# Patient Record
Sex: Male | Born: 1970 | Race: White | Hispanic: No | Marital: Married | Smoking: Never smoker
Health system: Southern US, Community
[De-identification: ages and names within clinical notes are randomized; demographics above are authoritative.]

## PROBLEM LIST (undated history)

## (undated) DIAGNOSIS — E079 Disorder of thyroid, unspecified: Secondary | ICD-10-CM

## (undated) DIAGNOSIS — N200 Calculus of kidney: Secondary | ICD-10-CM

## (undated) DIAGNOSIS — I10 Essential (primary) hypertension: Secondary | ICD-10-CM

---

## 2013-08-11 ENCOUNTER — Encounter: Payer: Self-pay | Admitting: Internal Medicine

## 2013-08-20 ENCOUNTER — Encounter (HOSPITAL_COMMUNITY): Payer: Self-pay | Admitting: Emergency Medicine

## 2013-08-20 ENCOUNTER — Emergency Department (HOSPITAL_COMMUNITY)
Admission: EM | Admit: 2013-08-20 | Discharge: 2013-08-20 | Disposition: A | Discharge status: Home / Self Care | Payer: BC Managed Care – PPO | Attending: Emergency Medicine | Admitting: Emergency Medicine

## 2013-08-20 ENCOUNTER — Emergency Department (HOSPITAL_COMMUNITY): Payer: BC Managed Care – PPO

## 2013-08-20 DIAGNOSIS — N200 Calculus of kidney: Secondary | ICD-10-CM | POA: Diagnosis present

## 2013-08-20 DIAGNOSIS — Z79899 Other long term (current) drug therapy: Secondary | ICD-10-CM | POA: Insufficient documentation

## 2013-08-20 DIAGNOSIS — I10 Essential (primary) hypertension: Secondary | ICD-10-CM | POA: Insufficient documentation

## 2013-08-20 DIAGNOSIS — R110 Nausea: Secondary | ICD-10-CM | POA: Insufficient documentation

## 2013-08-20 DIAGNOSIS — E079 Disorder of thyroid, unspecified: Secondary | ICD-10-CM | POA: Insufficient documentation

## 2013-08-20 HISTORY — DX: Disorder of thyroid, unspecified: E07.9

## 2013-08-20 HISTORY — DX: Essential (primary) hypertension: I10

## 2013-08-20 LAB — CBC WITH DIFFERENTIAL/PLATELET
Basophils Absolute: 0 10*3/uL (ref 0.0–0.1)
Basophils Relative: 0 % (ref 0–1)
Eosinophils Absolute: 0 10*3/uL (ref 0.0–0.7)
Eosinophils Relative: 0 % (ref 0–5)
HCT: 43.6 % (ref 39.0–52.0)
Hemoglobin: 15.3 g/dL (ref 13.0–17.0)
Lymphocytes Relative: 9 % — ABNORMAL LOW (ref 12–46)
Lymphs Abs: 1.2 10*3/uL (ref 0.7–4.0)
MCH: 29.9 pg (ref 26.0–34.0)
MCHC: 35.1 g/dL (ref 30.0–36.0)
MCV: 85.3 fL (ref 78.0–100.0)
Monocytes Absolute: 0.7 10*3/uL (ref 0.1–1.0)
Monocytes Relative: 5 % (ref 3–12)
Neutro Abs: 12.1 10*3/uL — ABNORMAL HIGH (ref 1.7–7.7)
Neutrophils Relative %: 87 % — ABNORMAL HIGH (ref 43–77)
Platelets: 278 10*3/uL (ref 150–400)
RBC: 5.11 MIL/uL (ref 4.22–5.81)
RDW: 12.9 % (ref 11.5–15.5)
WBC: 13.9 10*3/uL — ABNORMAL HIGH (ref 4.0–10.5)

## 2013-08-20 LAB — URINALYSIS, ROUTINE W REFLEX MICROSCOPIC
Bilirubin Urine: NEGATIVE
Glucose, UA: NEGATIVE mg/dL
Ketones, ur: NEGATIVE mg/dL
Leukocytes, UA: NEGATIVE
Nitrite: NEGATIVE
Protein, ur: NEGATIVE mg/dL
Specific Gravity, Urine: 1.029 (ref 1.005–1.030)
Urobilinogen, UA: 0.2 mg/dL (ref 0.0–1.0)
pH: 7 (ref 5.0–8.0)

## 2013-08-20 LAB — COMPREHENSIVE METABOLIC PANEL
ALT: 32 U/L (ref 0–53)
AST: 14 U/L (ref 0–37)
Albumin: 3.9 g/dL (ref 3.5–5.2)
Alkaline Phosphatase: 95 U/L (ref 39–117)
BUN: 21 mg/dL (ref 6–23)
CO2: 24 mEq/L (ref 19–32)
Calcium: 9.2 mg/dL (ref 8.4–10.5)
Chloride: 100 mEq/L (ref 96–112)
Creatinine, Ser: 1.17 mg/dL (ref 0.50–1.35)
GFR calc Af Amer: 87 mL/min — ABNORMAL LOW (ref >90)
GFR calc non Af Amer: 75 mL/min — ABNORMAL LOW (ref >90)
Glucose, Bld: 119 mg/dL — ABNORMAL HIGH (ref 70–99)
Potassium: 4.6 mEq/L (ref 3.7–5.3)
Sodium: 138 mEq/L (ref 137–147)
Total Bilirubin: 0.3 mg/dL (ref 0.3–1.2)
Total Protein: 7.3 g/dL (ref 6.0–8.3)

## 2013-08-20 LAB — URINE MICROSCOPIC-ADD ON

## 2013-08-20 MED ORDER — HYDROMORPHONE HCL PF 1 MG/ML IJ SOLN
1.0000 mg | Freq: Once | INTRAMUSCULAR | Status: AC
  Administered 2013-08-20: 1 mg via INTRAVENOUS
  Filled 2013-08-20: qty 1

## 2013-08-20 MED ORDER — SODIUM CHLORIDE 0.9 % IV BOLUS (SEPSIS)
1000.0000 mL | INTRAVENOUS | Status: AC
  Administered 2013-08-20: 1000 mL via INTRAVENOUS

## 2013-08-20 MED ORDER — HYDROMORPHONE HCL PF 1 MG/ML IJ SOLN: 0.5000 mg | Freq: Once | INTRAMUSCULAR | Status: DC

## 2013-08-20 MED ORDER — HYDROMORPHONE HCL PF 1 MG/ML IJ SOLN
1.0000 mg | INTRAMUSCULAR | Status: AC
  Administered 2013-08-20: 1 mg via INTRAVENOUS
  Filled 2013-08-20: qty 1

## 2013-08-20 MED ORDER — ONDANSETRON HCL 4 MG/2ML IJ SOLN
4.0000 mg | Freq: Once | INTRAMUSCULAR | Status: AC
  Administered 2013-08-20: 4 mg via INTRAVENOUS
  Filled 2013-08-20: qty 2

## 2013-08-20 MED ORDER — HYDROCODONE-ACETAMINOPHEN 5-325 MG PO TABS: 1.0000 | ORAL_TABLET | Freq: Four times a day (QID) | ORAL | Status: DC | PRN

## 2013-08-20 NOTE — Discharge Instructions (Signed)
Kidney Stones  Kidney stones (urolithiasis) are deposits that form inside your kidneys. The intense pain is caused by the stone moving through the urinary tract. When the stone moves, the ureter goes into spasm around the stone. The stone is usually passed in the urine.   CAUSES   · A disorder that makes certain neck glands produce too much parathyroid hormone (primary hyperparathyroidism).  · A buildup of uric acid crystals, similar to gout in your joints.  · Narrowing (stricture) of the ureter.  · A kidney obstruction present at birth (congenital obstruction).  · Previous surgery on the kidney or ureters.  · Numerous kidney infections.  SYMPTOMS   · Feeling sick to your stomach (nauseous).  · Throwing up (vomiting).  · Blood in the urine (hematuria).  · Pain that usually spreads (radiates) to the groin.  · Frequency or urgency of urination.  DIAGNOSIS   · Taking a history and physical exam.  · Blood or urine tests.  · CT scan.  · Occasionally, an examination of the inside of the urinary bladder (cystoscopy) is performed.  TREATMENT   · Observation.  · Increasing your fluid intake.  · Extracorporeal shock wave lithotripsy This is a noninvasive procedure that uses shock waves to break up kidney stones.  · Surgery may be needed if you have severe pain or persistent obstruction. There are various surgical procedures. Most of the procedures are performed with the use of small instruments. Only small incisions are needed to accommodate these instruments, so recovery time is minimized.  The size, location, and chemical composition are all important variables that will determine the proper choice of action for you. Talk to your health care provider to better understand your situation so that you will minimize the risk of injury to yourself and your kidney.   HOME CARE INSTRUCTIONS   · Drink enough water and fluids to keep your urine clear or pale yellow. This will help you to pass the stone or stone fragments.  · Strain  all urine through the provided strainer. Keep all particulate matter and stones for your health care provider to see. The stone causing the pain may be as small as a grain of salt. It is very important to use the strainer each and every time you pass your urine. The collection of your stone will allow your health care provider to analyze it and verify that a stone has actually passed. The stone analysis will often identify what you can do to reduce the incidence of recurrences.  · Only take over-the-counter or prescription medicines for pain, discomfort, or fever as directed by your health care provider.  · Make a follow-up appointment with your health care provider as directed.  · Get follow-up X-rays if required. The absence of pain does not always mean that the stone has passed. It may have only stopped moving. If the urine remains completely obstructed, it can cause loss of kidney function or even complete destruction of the kidney. It is your responsibility to make sure X-rays and follow-ups are completed. Ultrasounds of the kidney can show blockages and the status of the kidney. Ultrasounds are not associated with any radiation and can be performed easily in a matter of minutes.  SEEK MEDICAL CARE IF:  · You experience pain that is progressive and unresponsive to any pain medicine you have been prescribed.  SEEK IMMEDIATE MEDICAL CARE IF:   · Pain cannot be controlled with the prescribed medicine.  · You have a fever   or shaking chills.  · The severity or intensity of pain increases over 18 hours and is not relieved by pain medicine.  · You develop a new onset of abdominal pain.  · You feel faint or pass out.  · You are unable to urinate.  MAKE SURE YOU:   · Understand these instructions.  · Will watch your condition.  · Will get help right away if you are not doing well or get worse.  Document Released: 05/15/2005 Document Revised: 01/15/2013 Document Reviewed: 10/16/2012  [employer]® Patient Information ©2014  [employer].

## 2013-08-20 NOTE — ED Provider Notes (Signed)
CSN: [id number]     Arrival date & time 08/20/13  1434 History   First MD Initiated Contact with Patient 08/20/13 1522     Chief Complaint  Patient presents with  . Abdominal Pain     (Consider location/radiation/quality/duration/timing/severity/associated sxs/prior Treatment) Patient is a 43 y.o. male presenting with abdominal pain. The history is provided by the patient.  Abdominal Pain Pain location:  RLQ Pain quality: aching   Pain radiates to:  Does not radiate Pain severity:  Moderate Onset quality:  Sudden Duration:  12 hours Timing:  Constant Progression:  Unchanged Chronicity:  New Context comment:  While asleep Relieved by:  Nothing Worsened by:  Nothing tried Ineffective treatments:  None tried Associated symptoms: nausea   Associated symptoms: no chest pain, no cough, no diarrhea, no dysuria, no fever, no hematuria, no shortness of breath and no vomiting     Past Medical History  Diagnosis Date  . Hypertension   . Thyroid disease    History reviewed. No pertinent past surgical history. No family history on file. History  Substance Use Topics  . Smoking status: Never Smoker   . Smokeless tobacco: Not on file  . Alcohol Use: No    Review of Systems  Constitutional: Negative for fever.  HENT: Negative for drooling and rhinorrhea.   Eyes: Negative for pain.  Respiratory: Negative for cough and shortness of breath.   Cardiovascular: Negative for chest pain and leg swelling.  Gastrointestinal: Positive for nausea and abdominal pain. Negative for vomiting and diarrhea.  Genitourinary: Negative for dysuria and hematuria.  Musculoskeletal: Negative for gait problem and neck pain.  Skin: Negative for color change.  Neurological: Negative for numbness and headaches.  Hematological: Negative for adenopathy.  Psychiatric/Behavioral: Negative for behavioral problems.  All other systems reviewed and are negative.      Allergies  Sulfa antibiotics  Home  Medications  No current outpatient prescriptions on file. BP 146/75  Pulse 108  Temp(Src) 98.5 F (36.9 C) (Oral)  Resp 24  Wt 275 lb (124.739 kg)  SpO2 99% Physical Exam  Nursing note and vitals reviewed. Constitutional: He is oriented to person, place, and time. He appears well-developed and well-nourished.  HENT:  Head: Normocephalic and atraumatic.  Right Ear: External ear normal.  Left Ear: External ear normal.  Nose: Nose normal.  Mouth/Throat: Oropharynx is clear and moist. No oropharyngeal exudate.  Eyes: Conjunctivae and EOM are normal. Pupils are equal, round, and reactive to light.  Neck: Normal range of motion. Neck supple.  Cardiovascular: Normal rate, regular rhythm, normal heart sounds and intact distal pulses.  Exam reveals no gallop and no friction rub.   No murmur heard. Pulmonary/Chest: Effort normal and breath sounds normal. No respiratory distress. He has no wheezes.  Abdominal: Soft. Bowel sounds are normal. He exhibits no distension. There is no tenderness. There is no rebound and no guarding.  Musculoskeletal: Normal range of motion. He exhibits no edema and no tenderness.  No CVA tenderness to palpation noted bilaterally.  Neurological: He is alert and oriented to person, place, and time.  Skin: Skin is warm and dry.  Psychiatric: He has a normal mood and affect. His behavior is normal.    ED Course  Procedures (including critical care time) Labs Review Labs Reviewed  CBC WITH DIFFERENTIAL - Abnormal; Notable for the following:    WBC 13.9 (*)    Neutrophils Relative % 87 (*)    Neutro Abs 12.1 (*)    Lymphocytes Relative 9 (*)  All other components within normal limits  COMPREHENSIVE METABOLIC PANEL - Abnormal; Notable for the following:    Glucose, Bld 119 (*)    GFR calc non Af Amer 75 (*)    GFR calc Af Amer 87 (*)    All other components within normal limits  URINALYSIS, ROUTINE W REFLEX MICROSCOPIC - Abnormal; Notable for the following:     Hgb urine dipstick MODERATE (*)    All other components within normal limits  URINE CULTURE  URINE MICROSCOPIC-ADD ON   Imaging Review Ct Abdomen Pelvis Wo Contrast  08/20/2013   CLINICAL DATA:  Right lower quadrant abdominal pain with nausea. History of kidney stones.  EXAM: CT ABDOMEN AND PELVIS WITHOUT CONTRAST  TECHNIQUE: Multidetector CT imaging of the abdomen and pelvis was performed following the standard protocol without intravenous contrast.  COMPARISON:  None.  FINDINGS: The lung bases are clear aside from mild dependent atelectasis. There is no pleural or pericardial effusion.  The right kidney demonstrates mild hydronephrosis and perinephric soft tissue stranding. No renal calculi are demonstrated. However, there is a 3 mm distal right ureteral calculus. This is visualized on image 82 and is visible on the scout image. No other urinary tract calculi are seen. There is no left-sided hydronephrosis.  The liver demonstrates mildly decreased density consistent with steatosis. No focal lesion is apparent. The gallbladder, spleen, pancreas and adrenal glands appear normal.  The stomach, small bowel, appendix and colon demonstrate no significant findings. There are are no enlarged lymph nodes or vascular abnormalities on non contrast imaging. The prostate gland and seminal vesicles appear normal.  IMPRESSION: 1. Obstructing 3 mm calculus at the right ureterovesical junction with associated hydronephrosis. 2. No other evidence of urinary tract calculus. 3. Mild hepatic steatosis.   Electronically Signed   By: Roxy HorsemanBill  Veazey M.D.   On: 08/20/2013 17:23     EKG Interpretation None      MDM   Final diagnoses:  Nephrolithiasis    3:31 PM 43 y.o. male with a self-reported history of kidney stones who presents with right lower quadrant pain and frequency which awoke him from sleep at approximately 4:30 AM this morning and has been persistent since then. He has had nausea but denies any vomiting  or diarrhea. He is afebrile and mildly tachycardic here. His abdomen is soft and pain is not reproducible on exam. Will get screening labwork, pain and nausea control, and CT scan. The patient notes that his symptoms are similar to previous kidney stones. He states that [facility] but I cannot find these records in care everywhere.   7:12 PM: Ct w/ 3mm stone at right uvj. Pain controlled. UA/labs non-contrib.  I have discussed the diagnosis/risks/treatment options with the patient and believe the pt to be eligible for discharge home to follow-up with pcp or urology as needed. We also discussed returning to the ED immediately if new or worsening sx occur. We discussed the sx which are most concerning (e.g., worsening pain, fever, inability to tolerate po) that necessitate immediate return. Medications administered to the patient during their visit and any new prescriptions provided to the patient are listed below.  Medications given during this visit Medications  HYDROmorphone (DILAUDID) injection 1 mg (1 mg Intravenous Given 08/20/13 1540)  sodium chloride 0.9 % bolus 1,000 mL (0 mLs Intravenous Stopped 08/20/13 1908)  ondansetron (ZOFRAN) injection 4 mg (4 mg Intravenous Given 08/20/13 1540)  HYDROmorphone (DILAUDID) injection 1  mg (1 mg Intravenous Given 08/20/13 1840)    New Prescriptions   HYDROCODONE-ACETAMINOPHEN (NORCO) 5-325 MG PER TABLET    Take 1 tablet by mouth every 6 (six) hours as needed for moderate pain.     Junius Argyle, MD 08/21/13 0002

## 2013-08-20 NOTE — ED Notes (Signed)
Pt reporting RLQ since this morning. States feels he has to constantly pee. Has hx of kidney stone. Reports nausea, appears flushed and is having hot/cold flashes, states he has been sweating all day while working inside. Denies CP or SOB. Pt is a x 4.

## 2013-08-20 NOTE — ED Notes (Signed)
Patient returned from CT

## 2013-08-21 LAB — URINE CULTURE
Colony Count: NO GROWTH
Culture: NO GROWTH
Special Requests: NORMAL

## 2013-10-02 ENCOUNTER — Ambulatory Visit: Payer: BC Managed Care – PPO | Admitting: Internal Medicine

## 2015-10-18 DIAGNOSIS — Z0000 Encounter for general adult medical examination without abnormal findings: Secondary | ICD-10-CM | POA: Diagnosis not present

## 2015-10-18 DIAGNOSIS — E039 Hypothyroidism, unspecified: Secondary | ICD-10-CM | POA: Diagnosis not present

## 2015-10-22 DIAGNOSIS — Z0000 Encounter for general adult medical examination without abnormal findings: Secondary | ICD-10-CM | POA: Diagnosis not present

## 2015-10-22 DIAGNOSIS — Z6841 Body Mass Index (BMI) 40.0 and over, adult: Secondary | ICD-10-CM | POA: Diagnosis not present

## 2015-12-13 DIAGNOSIS — E039 Hypothyroidism, unspecified: Secondary | ICD-10-CM | POA: Diagnosis not present

## 2016-03-30 DIAGNOSIS — Z23 Encounter for immunization: Secondary | ICD-10-CM | POA: Diagnosis not present

## 2016-10-24 DIAGNOSIS — E039 Hypothyroidism, unspecified: Secondary | ICD-10-CM | POA: Diagnosis not present

## 2016-10-24 DIAGNOSIS — Z0000 Encounter for general adult medical examination without abnormal findings: Secondary | ICD-10-CM | POA: Diagnosis not present

## 2016-10-27 DIAGNOSIS — Z0001 Encounter for general adult medical examination with abnormal findings: Secondary | ICD-10-CM | POA: Diagnosis not present

## 2016-10-27 DIAGNOSIS — E039 Hypothyroidism, unspecified: Secondary | ICD-10-CM | POA: Diagnosis not present

## 2016-10-27 DIAGNOSIS — Z1212 Encounter for screening for malignant neoplasm of rectum: Secondary | ICD-10-CM | POA: Diagnosis not present

## 2016-10-27 DIAGNOSIS — G43909 Migraine, unspecified, not intractable, without status migrainosus: Secondary | ICD-10-CM | POA: Diagnosis not present

## 2016-10-27 DIAGNOSIS — Z833 Family history of diabetes mellitus: Secondary | ICD-10-CM | POA: Diagnosis not present

## 2017-01-12 DIAGNOSIS — Z0000 Encounter for general adult medical examination without abnormal findings: Secondary | ICD-10-CM | POA: Diagnosis not present

## 2017-01-12 DIAGNOSIS — M25562 Pain in left knee: Secondary | ICD-10-CM | POA: Diagnosis not present

## 2017-01-12 DIAGNOSIS — M2242 Chondromalacia patellae, left knee: Secondary | ICD-10-CM | POA: Diagnosis not present

## 2017-01-12 DIAGNOSIS — E039 Hypothyroidism, unspecified: Secondary | ICD-10-CM | POA: Diagnosis not present

## 2017-10-19 DIAGNOSIS — I10 Essential (primary) hypertension: Secondary | ICD-10-CM | POA: Diagnosis not present

## 2017-11-02 DIAGNOSIS — Z0000 Encounter for general adult medical examination without abnormal findings: Secondary | ICD-10-CM | POA: Diagnosis not present

## 2017-12-30 ENCOUNTER — Emergency Department (HOSPITAL_COMMUNITY)
Admission: EM | Admit: 2017-12-30 | Discharge: 2017-12-30 | Disposition: A | Discharge status: Home / Self Care | Payer: BLUE CROSS/BLUE SHIELD | Attending: Emergency Medicine | Admitting: Emergency Medicine

## 2017-12-30 ENCOUNTER — Other Ambulatory Visit: Payer: Self-pay

## 2017-12-30 ENCOUNTER — Emergency Department (HOSPITAL_COMMUNITY): Payer: BLUE CROSS/BLUE SHIELD

## 2017-12-30 ENCOUNTER — Encounter (HOSPITAL_COMMUNITY): Payer: Self-pay | Admitting: Emergency Medicine

## 2017-12-30 DIAGNOSIS — M545 Low back pain, unspecified: Secondary | ICD-10-CM

## 2017-12-30 DIAGNOSIS — R457 State of emotional shock and stress, unspecified: Secondary | ICD-10-CM | POA: Diagnosis not present

## 2017-12-30 DIAGNOSIS — M5489 Other dorsalgia: Secondary | ICD-10-CM | POA: Diagnosis not present

## 2017-12-30 DIAGNOSIS — E079 Disorder of thyroid, unspecified: Secondary | ICD-10-CM | POA: Insufficient documentation

## 2017-12-30 DIAGNOSIS — Z79899 Other long term (current) drug therapy: Secondary | ICD-10-CM | POA: Insufficient documentation

## 2017-12-30 DIAGNOSIS — I10 Essential (primary) hypertension: Secondary | ICD-10-CM | POA: Diagnosis not present

## 2017-12-30 HISTORY — DX: Calculus of kidney: N20.0

## 2017-12-30 LAB — I-STAT CHEM 8, ED
BUN: 16 mg/dL (ref 6–20)
Calcium, Ion: 1.16 mmol/L (ref 1.15–1.40)
Chloride: 104 mmol/L (ref 98–111)
Creatinine, Ser: 0.6 mg/dL — ABNORMAL LOW (ref 0.61–1.24)
Glucose, Bld: 104 mg/dL — ABNORMAL HIGH (ref 70–99)
HCT: 42 % (ref 39.0–52.0)
Hemoglobin: 14.3 g/dL (ref 13.0–17.0)
Potassium: 4 mmol/L (ref 3.5–5.1)
Sodium: 141 mmol/L (ref 135–145)
TCO2: 25 mmol/L (ref 22–32)

## 2017-12-30 MED ORDER — ONDANSETRON HCL 4 MG/2ML IJ SOLN
4.0000 mg | Freq: Once | INTRAMUSCULAR | Status: AC
  Administered 2017-12-30: 4 mg via INTRAVENOUS
  Filled 2017-12-30: qty 2

## 2017-12-30 MED ORDER — HYDROMORPHONE HCL 1 MG/ML IJ SOLN
1.0000 mg | Freq: Once | INTRAMUSCULAR | Status: AC
  Administered 2017-12-30: 1 mg via INTRAVENOUS
  Filled 2017-12-30: qty 1

## 2017-12-30 MED ORDER — HYDROCODONE-ACETAMINOPHEN 5-325 MG PO TABS: 1.0000 | ORAL_TABLET | ORAL | 0 refills | Status: DC | PRN

## 2017-12-30 MED ORDER — LIDOCAINE 5 % EX PTCH
1.0000 | MEDICATED_PATCH | CUTANEOUS | Status: DC
  Administered 2017-12-30: 1 via TRANSDERMAL
  Filled 2017-12-30 (×2): qty 1

## 2017-12-30 MED ORDER — KETOROLAC TROMETHAMINE 30 MG/ML IJ SOLN
15.0000 mg | Freq: Once | INTRAMUSCULAR | Status: AC
  Administered 2017-12-30: 15 mg via INTRAVENOUS
  Filled 2017-12-30: qty 1

## 2017-12-30 MED ORDER — LIDOCAINE 5 % EX PTCH: 1.0000 | MEDICATED_PATCH | CUTANEOUS | 0 refills | Status: DC

## 2017-12-30 MED ORDER — CYCLOBENZAPRINE HCL 10 MG PO TABS: 10.0000 mg | ORAL_TABLET | Freq: Two times a day (BID) | ORAL | 0 refills | Status: DC | PRN

## 2017-12-30 NOTE — ED Triage Notes (Signed)
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 No hx of back issues - has had back pain in past, not "like this" per pt.  Did take Ibuprofen 600mg  po at 6am without relief. Is nauseated at present. Has not taken all reg meds.  No bowel or bladder incontinence

## 2017-12-30 NOTE — Discharge Instructions (Signed)
Please take Ibuprofen 600mg  every 6-8 hours with food for pain/inflammation Use Lidocaine patch every 12 hours. They are also over the counter Try Flexeril (muscle relaxer) for muscle spasms/pain as needed Take Norco (pain medicine) as needed for moderate-severe pain.  Follow up with your PCP Return if you are worsening

## 2017-12-30 NOTE — ED Provider Notes (Signed)
[facility] EMERGENCY DEPARTMENT Provider Note   CSN: [id number] Arrival date & time: 12/30/17  0747     History   Chief Complaint Chief Complaint  Patient presents with  . Back Pain    HPI Logan George is a 47 y.o. male who presents with back pain.  Past medical history significant for hypertension, history of kidney stones.  Patient was brought in by EMS due to an acute onset of back pain at 6 PM last night when he was playing with a toddler on the floor.  He twisted and felt a pop in his lower back.  Pain is constant severe nonradiating.  Is worse on the right side.  He took Advil last night and this morning without significant relief as well as heat and ice.  He states he cannot get into a comfortable position.  He denies abdominal pain, vomiting, urinary symptoms.  He does feel nausea but attributes this to taking Advil on an empty stomach. No fever, syncope, trauma, unexplained weight loss, hx of cancer, loss of bowel/bladder function, saddle anesthesia, urinary retention, IVDU. No numbness/tingling or weakness of the legs. He's been able to walk but it's very difficult.  HPI  Past Medical History:  Diagnosis Date  . Hypertension   . Kidney stones   . Thyroid disease     Patient Active Problem List   Diagnosis Date Noted  . Nephrolithiasis 08/20/2013    History reviewed. No pertinent surgical history.      Home Medications    Prior to Admission medications   Medication Sig Start Date End Date Taking? Authorizing Provider  ciprofloxacin (CIPRO) 500 MG tablet Take 500 mg by mouth 2 (two) times daily.    [provider]  HYDROcodone-acetaminophen (NORCO) 5-325 MG per tablet Take 1 tablet by mouth every 6 (six) hours as needed for moderate pain. 08/20/13   Purvis SheffieldHarrison, Forrest, MD  ibuprofen (ADVIL,MOTRIN) 200 MG tablet Take 600 mg by mouth every 6 (six) hours as needed for fever, headache or mild pain.    [provider]  levothyroxine  (SYNTHROID, LEVOTHROID) 75 MCG tablet Take 75 mcg by mouth daily before breakfast.    [provider]  olmesartan (BENICAR) 20 MG tablet Take 10 mg by mouth daily.    [provider]  rizatriptan (MAXALT-MLT) 10 MG disintegrating tablet Take 10 mg by mouth 2 (two) times daily as needed for migraine. May repeat in 2 hours if needed    [provider]    Family History No family history on file.  Social History Social History   Tobacco Use  . Smoking status: Never Smoker  . Smokeless tobacco: Never Used  Substance Use Topics  . Alcohol use: No  . Drug use: No     Allergies   Sulfa antibiotics   Review of Systems Review of Systems  Gastrointestinal: Negative for abdominal pain, nausea and vomiting.  Genitourinary: Negative for difficulty urinating and dysuria.  Musculoskeletal: Positive for back pain and myalgias.  Neurological: Negative for weakness and numbness.  All other systems reviewed and are negative.    Physical Exam Updated Vital Signs BP 132/69 (BP Location: Right Arm)   Pulse 71   Temp 97.6 F (36.4 C) (Oral)   Resp 20   Ht 5\' 8"  (1.727 m)   Wt 128.4 kg (283 lb)   SpO2 98%   BMI 43.03 kg/m   Physical Exam  Constitutional: He is oriented to person, place, and time. He appears well-developed  and well-nourished. He appears distressed.  HENT:  Head: Normocephalic and atraumatic.  Eyes: Pupils are equal, round, and reactive to light. Conjunctivae are normal. Right eye exhibits no discharge. Left eye exhibits no discharge. No scleral icterus.  Neck: Normal range of motion.  Cardiovascular: Normal rate and regular rhythm.  Pulmonary/Chest: Effort normal and breath sounds normal. No respiratory distress.  Abdominal: Soft. Bowel sounds are normal. He exhibits no distension. There is no tenderness.  Neurological: He is alert and oriented to person, place, and time.  Skin: Skin is warm and dry.  Psychiatric: He has a normal mood and  affect. His behavior is normal.  Nursing note and vitals reviewed.    ED Treatments / Results  Labs (all labs ordered are listed, but only abnormal results are displayed) Labs Reviewed  I-STAT BETA HCG BLOOD, ED ([facility], [facility], [facility] ONLY)    EKG None  Radiology No results found.  Procedures Procedures (including critical care time)  Medications Ordered in ED Medications  ketorolac (TORADOL) 30 MG/ML injection 15 mg (has no administration in time range)  HYDROmorphone (DILAUDID) injection 1 mg (has no administration in time range)  lidocaine (LIDODERM) 5 % 1 patch (has no administration in time range)     Initial Impression / Assessment and Plan / ED Course  I have reviewed the triage vital signs and the nursing notes.  Pertinent labs & imaging results that were available during my care of the patient were reviewed by me and considered in my medical decision making (see chart for details).  47 year old male presents with acute onset of severe low back pain after twisting and feeling a pop in his lower back. He has been ambulatory with great difficulty. He does not have radicular symptoms at this time. No bowel/bladder incontinence or saddle anesthesia. He has point tenderness over the right lower back. I believe he may have an acute herniated disc. He has no red flags but will send for lumbar imaging and provide pain control   Pain is improved after toradol, lidocaine patch, dilaudid.   11:34 AM Xray shows DDD of L2-L3.Pain is returning. Will redose meds. Discussed plan of care with the patient and his wife which includes rest for 2-3 days, pain control, f/u with PCP for possible outpatient MRI or physical therapy when pain is improved.   Final Clinical Impressions(s) / ED Diagnoses   Final diagnoses:  Acute right-sided low back pain without sciatica    ED Discharge Orders    None       Bethel Born, PA-C 12/30/17 1314    Tilden Fossa, MD 12/31/17 [phone]

## 2018-01-01 DIAGNOSIS — M545 Low back pain: Secondary | ICD-10-CM | POA: Diagnosis not present

## 2018-01-01 DIAGNOSIS — M5416 Radiculopathy, lumbar region: Secondary | ICD-10-CM | POA: Diagnosis not present

## 2018-01-01 DIAGNOSIS — R110 Nausea: Secondary | ICD-10-CM | POA: Diagnosis not present

## 2018-01-02 ENCOUNTER — Ambulatory Visit (INDEPENDENT_AMBULATORY_CARE_PROVIDER_SITE_OTHER): Payer: Self-pay | Admitting: Surgery

## 2018-01-02 ENCOUNTER — Other Ambulatory Visit: Payer: Self-pay | Admitting: Internal Medicine

## 2018-01-02 DIAGNOSIS — M545 Low back pain: Secondary | ICD-10-CM

## 2018-01-04 DIAGNOSIS — M545 Low back pain: Secondary | ICD-10-CM | POA: Diagnosis not present

## 2018-01-04 DIAGNOSIS — M5136 Other intervertebral disc degeneration, lumbar region: Secondary | ICD-10-CM | POA: Diagnosis not present

## 2018-01-18 DIAGNOSIS — M545 Low back pain: Secondary | ICD-10-CM | POA: Diagnosis not present

## 2018-01-21 DIAGNOSIS — M545 Low back pain: Secondary | ICD-10-CM | POA: Diagnosis not present

## 2018-01-23 DIAGNOSIS — M545 Low back pain: Secondary | ICD-10-CM | POA: Diagnosis not present

## 2018-01-29 DIAGNOSIS — M545 Low back pain: Secondary | ICD-10-CM | POA: Diagnosis not present

## 2018-01-31 DIAGNOSIS — M545 Low back pain: Secondary | ICD-10-CM | POA: Diagnosis not present

## 2018-02-01 ENCOUNTER — Ambulatory Visit (INDEPENDENT_AMBULATORY_CARE_PROVIDER_SITE_OTHER): Payer: BLUE CROSS/BLUE SHIELD | Admitting: Podiatry

## 2018-02-01 ENCOUNTER — Encounter: Payer: Self-pay | Admitting: Podiatry

## 2018-02-01 DIAGNOSIS — L600 Ingrowing nail: Secondary | ICD-10-CM

## 2018-02-01 NOTE — Patient Instructions (Signed)

## 2018-02-03 NOTE — Progress Notes (Signed)
Subjective:   Patient ID: Logan George, male   DOB: 47 y.o.   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  He is been trying to trim them and trying other modalities and simply the pain is becoming too extensive and is not noting drainage.  He is out of work for several weeks with his back and this is an opportunity to correct the deformity and patient does not smoke and likes to be active   Review of Systems  All other systems reviewed and are negative.       Objective:  Physical Exam  Constitutional: He appears well-developed and well-nourished.  Cardiovascular: Intact distal pulses.  Pulmonary/Chest: Effort normal.  Musculoskeletal: Normal range of motion.  Neurological: He is alert.  Skin: Skin is warm.  Nursing note and vitals reviewed.   Neurovascular status intact muscle strength is found to be adequate range of motion within normal limits with patient noted to have a thickened dystrophic hallux nail left foot both medial lateral and across the entire surface and on the right hallux its the medial and lateral borders that are incurvated with the center of the nail being healthy.  Patient does have good digital perfusion and is well oriented x3     Assessment:  Chronic ingrown toenail deformity hallux bilateral with the entire nail left involved in the borders of the right hallux     Plan:  H&P conditions reviewed and recommended removal of the nails.  The left one we will do the entire nail permanently in the right one we will do the borders and at this point I allowed patient and caregiver to read consent form going over all possible complications and they are willing to accept risk of procedure and signed consent form.  I infiltrated each hallux 60 mg like Marcaine mixture remove the hallux nail left and the borders right exposed matrix and applied phenol at 5 applications left 3 applications each  border right of phenol followed by alcohol lavage and sterile dressing.  Gave instructions on soaks and reappoint and encouraged to call with any questions or concerns

## 2018-02-06 DIAGNOSIS — M545 Low back pain: Secondary | ICD-10-CM | POA: Diagnosis not present

## 2018-02-08 DIAGNOSIS — M545 Low back pain: Secondary | ICD-10-CM | POA: Diagnosis not present

## 2018-02-08 DIAGNOSIS — Z7689 Persons encountering health services in other specified circumstances: Secondary | ICD-10-CM | POA: Diagnosis not present

## 2018-02-15 DIAGNOSIS — Z23 Encounter for immunization: Secondary | ICD-10-CM | POA: Diagnosis not present

## 2018-02-20 DIAGNOSIS — M545 Low back pain: Secondary | ICD-10-CM | POA: Diagnosis not present

## 2018-02-22 DIAGNOSIS — M545 Low back pain: Secondary | ICD-10-CM | POA: Diagnosis not present

## 2018-02-25 DIAGNOSIS — M545 Low back pain: Secondary | ICD-10-CM | POA: Diagnosis not present

## 2018-02-27 DIAGNOSIS — M5416 Radiculopathy, lumbar region: Secondary | ICD-10-CM | POA: Diagnosis not present

## 2018-02-27 DIAGNOSIS — M545 Low back pain: Secondary | ICD-10-CM | POA: Diagnosis not present

## 2018-02-28 DIAGNOSIS — M545 Low back pain: Secondary | ICD-10-CM | POA: Diagnosis not present

## 2018-03-05 DIAGNOSIS — M545 Low back pain: Secondary | ICD-10-CM | POA: Diagnosis not present

## 2018-03-08 DIAGNOSIS — M545 Low back pain: Secondary | ICD-10-CM | POA: Diagnosis not present

## 2018-03-12 DIAGNOSIS — M545 Low back pain: Secondary | ICD-10-CM | POA: Diagnosis not present

## 2018-03-15 DIAGNOSIS — M545 Low back pain: Secondary | ICD-10-CM | POA: Diagnosis not present

## 2018-03-16 DIAGNOSIS — M5126 Other intervertebral disc displacement, lumbar region: Secondary | ICD-10-CM | POA: Diagnosis not present

## 2018-03-19 DIAGNOSIS — M545 Low back pain: Secondary | ICD-10-CM | POA: Diagnosis not present

## 2018-03-22 DIAGNOSIS — M545 Low back pain: Secondary | ICD-10-CM | POA: Diagnosis not present

## 2018-03-26 DIAGNOSIS — M545 Low back pain: Secondary | ICD-10-CM | POA: Diagnosis not present

## 2018-04-01 DIAGNOSIS — M545 Low back pain: Secondary | ICD-10-CM | POA: Diagnosis not present

## 2018-04-01 DIAGNOSIS — M5126 Other intervertebral disc displacement, lumbar region: Secondary | ICD-10-CM | POA: Diagnosis not present

## 2018-04-10 DIAGNOSIS — J039 Acute tonsillitis, unspecified: Secondary | ICD-10-CM | POA: Diagnosis not present

## 2018-04-10 DIAGNOSIS — J101 Influenza due to other identified influenza virus with other respiratory manifestations: Secondary | ICD-10-CM | POA: Diagnosis not present

## 2018-04-10 DIAGNOSIS — J0100 Acute maxillary sinusitis, unspecified: Secondary | ICD-10-CM | POA: Diagnosis not present

## 2018-11-04 DIAGNOSIS — Z1159 Encounter for screening for other viral diseases: Secondary | ICD-10-CM | POA: Diagnosis not present

## 2018-11-04 DIAGNOSIS — E039 Hypothyroidism, unspecified: Secondary | ICD-10-CM | POA: Diagnosis not present

## 2018-11-04 DIAGNOSIS — Z0000 Encounter for general adult medical examination without abnormal findings: Secondary | ICD-10-CM | POA: Diagnosis not present

## 2018-11-04 DIAGNOSIS — I10 Essential (primary) hypertension: Secondary | ICD-10-CM | POA: Diagnosis not present

## 2018-11-08 DIAGNOSIS — Z0000 Encounter for general adult medical examination without abnormal findings: Secondary | ICD-10-CM | POA: Diagnosis not present

## 2018-11-08 DIAGNOSIS — G43909 Migraine, unspecified, not intractable, without status migrainosus: Secondary | ICD-10-CM | POA: Diagnosis not present

## 2018-11-08 DIAGNOSIS — Z1212 Encounter for screening for malignant neoplasm of rectum: Secondary | ICD-10-CM | POA: Diagnosis not present

## 2018-11-08 DIAGNOSIS — Z833 Family history of diabetes mellitus: Secondary | ICD-10-CM | POA: Diagnosis not present

## 2018-11-08 DIAGNOSIS — E039 Hypothyroidism, unspecified: Secondary | ICD-10-CM | POA: Diagnosis not present

## 2019-01-09 DIAGNOSIS — E039 Hypothyroidism, unspecified: Secondary | ICD-10-CM | POA: Diagnosis not present

## 2019-02-14 DIAGNOSIS — K921 Melena: Secondary | ICD-10-CM | POA: Diagnosis not present

## 2019-02-14 DIAGNOSIS — Z23 Encounter for immunization: Secondary | ICD-10-CM | POA: Diagnosis not present

## 2019-02-14 DIAGNOSIS — K639 Disease of intestine, unspecified: Secondary | ICD-10-CM | POA: Diagnosis not present

## 2019-02-14 DIAGNOSIS — R5382 Chronic fatigue, unspecified: Secondary | ICD-10-CM | POA: Diagnosis not present

## 2019-02-14 DIAGNOSIS — E039 Hypothyroidism, unspecified: Secondary | ICD-10-CM | POA: Diagnosis not present

## 2019-02-15 DIAGNOSIS — E039 Hypothyroidism, unspecified: Secondary | ICD-10-CM | POA: Diagnosis not present

## 2019-02-15 DIAGNOSIS — Z87891 Personal history of nicotine dependence: Secondary | ICD-10-CM | POA: Diagnosis not present

## 2019-02-15 DIAGNOSIS — Z8349 Family history of other endocrine, nutritional and metabolic diseases: Secondary | ICD-10-CM | POA: Diagnosis not present

## 2019-02-15 DIAGNOSIS — R5382 Chronic fatigue, unspecified: Secondary | ICD-10-CM | POA: Diagnosis not present

## 2019-04-09 DIAGNOSIS — Z1159 Encounter for screening for other viral diseases: Secondary | ICD-10-CM | POA: Diagnosis not present

## 2019-04-14 DIAGNOSIS — K648 Other hemorrhoids: Secondary | ICD-10-CM | POA: Diagnosis not present

## 2019-04-14 DIAGNOSIS — R197 Diarrhea, unspecified: Secondary | ICD-10-CM | POA: Diagnosis not present

## 2019-04-14 DIAGNOSIS — Z800 Family history of malignant neoplasm of digestive organs: Secondary | ICD-10-CM | POA: Diagnosis not present

## 2019-04-14 DIAGNOSIS — Z8371 Family history of colonic polyps: Secondary | ICD-10-CM | POA: Diagnosis not present

## 2019-04-14 DIAGNOSIS — K921 Melena: Secondary | ICD-10-CM | POA: Diagnosis not present

## 2019-10-03 IMAGING — DX DG LUMBAR SPINE COMPLETE 4+V
5 series · 5 of 5 positions shown · non-contrast
Comparison: None.

CLINICAL DATA: Low back injury last night.  Right-sided back pain.

EXAM:
LUMBAR SPINE - COMPLETE 4+ VIEW

[l-spine ap]
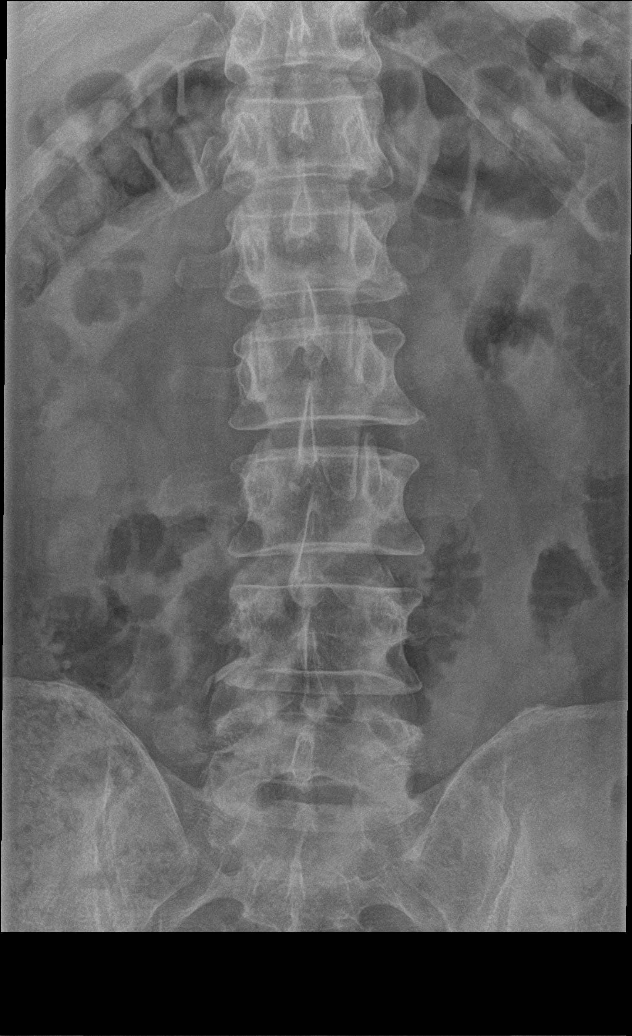

[l-spine obl (1 of 2)]
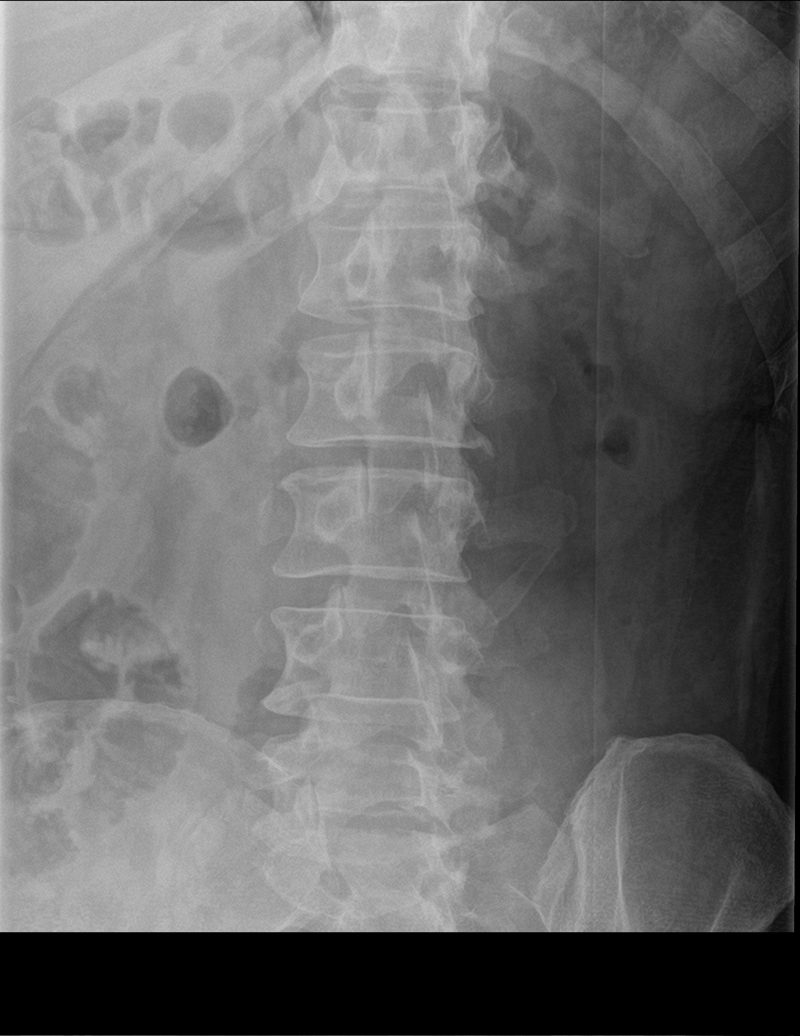

[l-spine obl (2 of 2)]
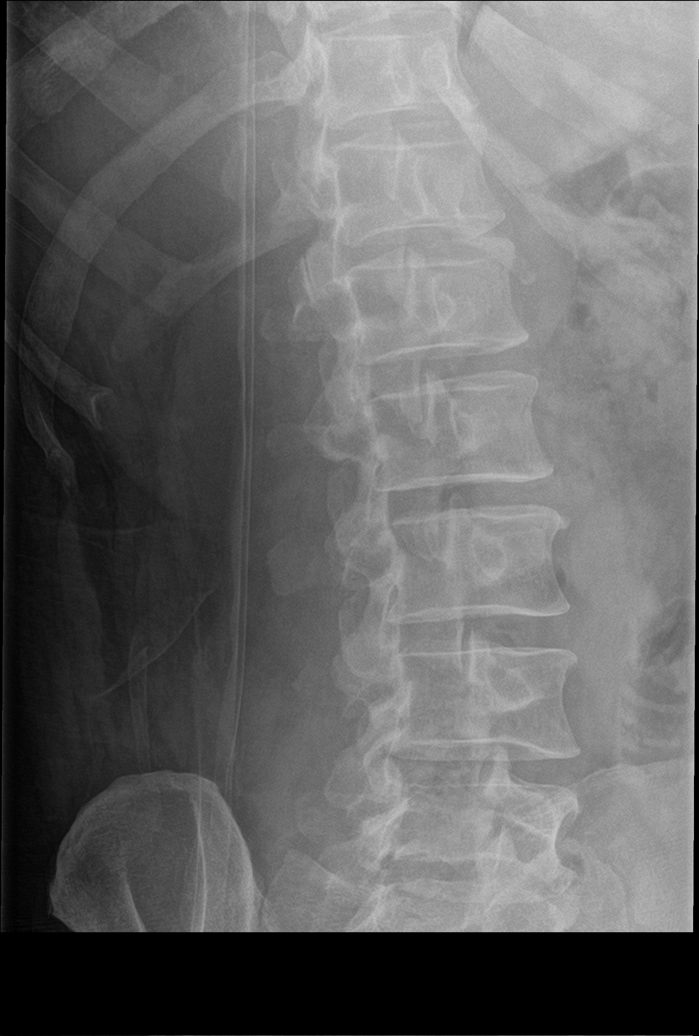

[l-spine lat]
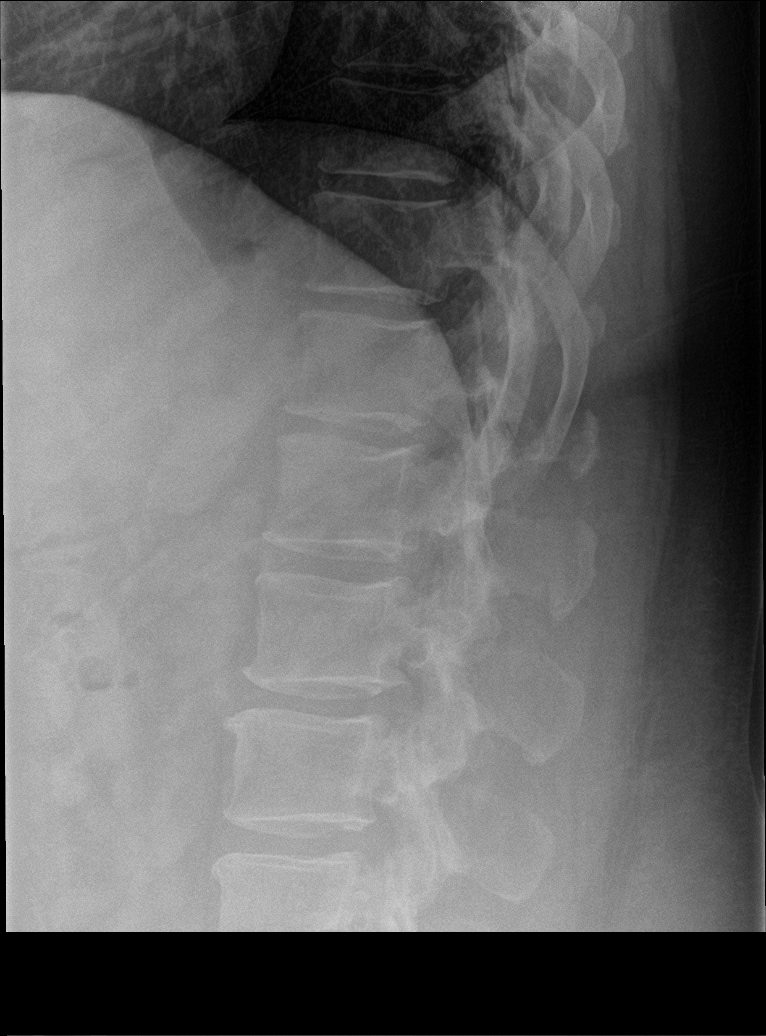

[l-spine spot]
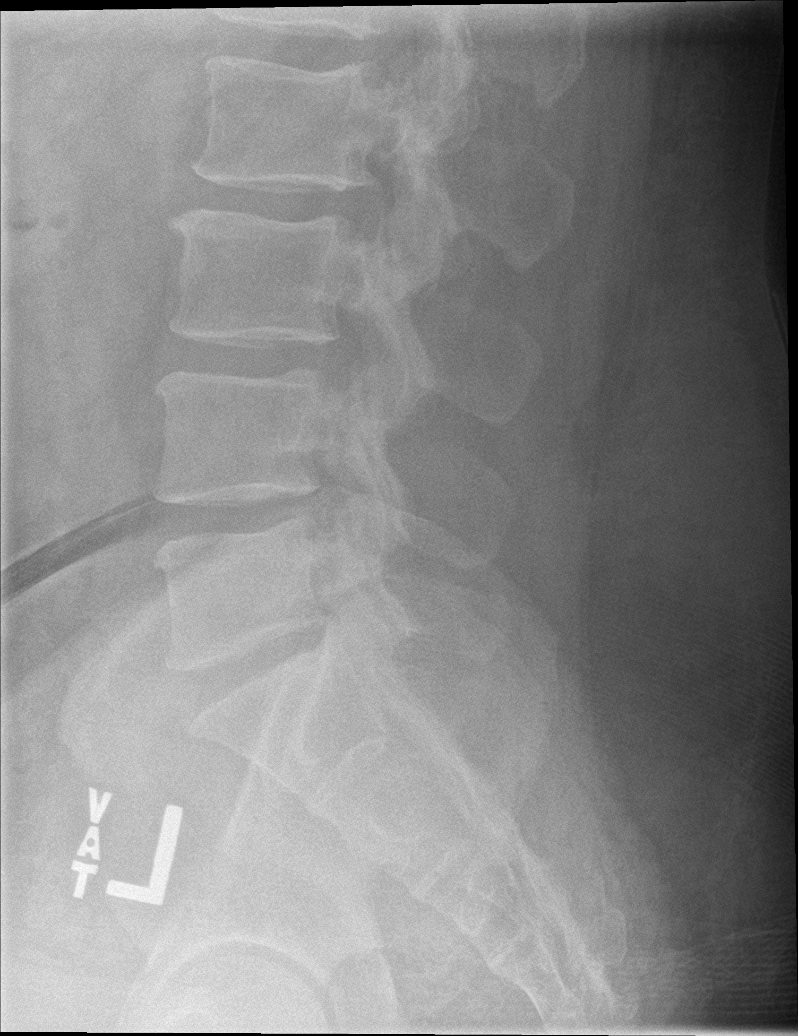

[5 of 5 positions shown; findings below may reference images not displayed]

FINDINGS: There is no evidence of lumbar spine fracture. Alignment is normal.
Mild degenerative disc disease at L2-3. No evidence of facet
arthropathy or other significant bone abnormality.
IMPRESSION: No acute findings.

Mild L2-3 degenerative disc disease.

## 2019-11-14 DIAGNOSIS — Z125 Encounter for screening for malignant neoplasm of prostate: Secondary | ICD-10-CM | POA: Diagnosis not present

## 2019-11-14 DIAGNOSIS — I10 Essential (primary) hypertension: Secondary | ICD-10-CM | POA: Diagnosis not present

## 2019-11-14 DIAGNOSIS — E039 Hypothyroidism, unspecified: Secondary | ICD-10-CM | POA: Diagnosis not present

## 2019-11-21 DIAGNOSIS — Z0000 Encounter for general adult medical examination without abnormal findings: Secondary | ICD-10-CM | POA: Diagnosis not present

## 2019-12-02 ENCOUNTER — Other Ambulatory Visit (HOSPITAL_COMMUNITY): Payer: Self-pay | Admitting: Internal Medicine

## 2019-12-02 ENCOUNTER — Other Ambulatory Visit: Payer: Self-pay | Admitting: Internal Medicine

## 2019-12-02 DIAGNOSIS — E039 Hypothyroidism, unspecified: Secondary | ICD-10-CM

## 2019-12-02 DIAGNOSIS — E0590 Thyrotoxicosis, unspecified without thyrotoxic crisis or storm: Secondary | ICD-10-CM

## 2019-12-16 ENCOUNTER — Other Ambulatory Visit (HOSPITAL_COMMUNITY): Payer: BLUE CROSS/BLUE SHIELD

## 2019-12-17 ENCOUNTER — Other Ambulatory Visit (HOSPITAL_COMMUNITY): Payer: BLUE CROSS/BLUE SHIELD

## 2020-01-29 ENCOUNTER — Encounter (HOSPITAL_COMMUNITY)
Admission: RE | Admit: 2020-01-29 | Discharge: 2020-01-29 | Disposition: A | Discharge status: Home / Self Care | Payer: BC Managed Care – PPO | Source: Ambulatory Visit | Attending: Internal Medicine | Admitting: Internal Medicine

## 2020-01-29 ENCOUNTER — Other Ambulatory Visit: Payer: Self-pay

## 2020-01-29 DIAGNOSIS — E0590 Thyrotoxicosis, unspecified without thyrotoxic crisis or storm: Secondary | ICD-10-CM | POA: Diagnosis not present

## 2020-01-29 MED ORDER — SODIUM IODIDE I-123 7.4 MBQ CAPS
436.0000 | ORAL_CAPSULE | Freq: Once | ORAL | Status: AC
  Administered 2020-01-29: 436 via ORAL

## 2020-01-30 ENCOUNTER — Encounter (HOSPITAL_COMMUNITY)
Admission: RE | Admit: 2020-01-30 | Discharge: 2020-01-30 | Disposition: A | Discharge status: Home / Self Care | Payer: BC Managed Care – PPO | Source: Ambulatory Visit | Attending: Internal Medicine | Admitting: Internal Medicine

## 2020-01-30 DIAGNOSIS — E049 Nontoxic goiter, unspecified: Secondary | ICD-10-CM | POA: Diagnosis not present

## 2020-01-30 DIAGNOSIS — E0590 Thyrotoxicosis, unspecified without thyrotoxic crisis or storm: Secondary | ICD-10-CM | POA: Diagnosis not present

## 2020-03-20 DIAGNOSIS — J069 Acute upper respiratory infection, unspecified: Secondary | ICD-10-CM | POA: Diagnosis not present

## 2020-11-19 DIAGNOSIS — Z0000 Encounter for general adult medical examination without abnormal findings: Secondary | ICD-10-CM | POA: Diagnosis not present

## 2020-11-19 DIAGNOSIS — E039 Hypothyroidism, unspecified: Secondary | ICD-10-CM | POA: Diagnosis not present

## 2020-11-19 DIAGNOSIS — E7801 Familial hypercholesterolemia: Secondary | ICD-10-CM | POA: Diagnosis not present

## 2020-11-26 DIAGNOSIS — Z0001 Encounter for general adult medical examination with abnormal findings: Secondary | ICD-10-CM | POA: Diagnosis not present

## 2020-11-26 DIAGNOSIS — Z8042 Family history of malignant neoplasm of prostate: Secondary | ICD-10-CM | POA: Diagnosis not present

## 2020-12-01 ENCOUNTER — Other Ambulatory Visit: Payer: Self-pay | Admitting: Internal Medicine

## 2020-12-01 DIAGNOSIS — I10 Essential (primary) hypertension: Secondary | ICD-10-CM

## 2020-12-05 DIAGNOSIS — R051 Acute cough: Secondary | ICD-10-CM | POA: Diagnosis not present

## 2020-12-05 DIAGNOSIS — R519 Headache, unspecified: Secondary | ICD-10-CM | POA: Diagnosis not present

## 2020-12-05 DIAGNOSIS — R0600 Dyspnea, unspecified: Secondary | ICD-10-CM | POA: Diagnosis not present

## 2020-12-05 DIAGNOSIS — Z20828 Contact with and (suspected) exposure to other viral communicable diseases: Secondary | ICD-10-CM | POA: Diagnosis not present

## 2021-01-28 ENCOUNTER — Other Ambulatory Visit: Payer: BC Managed Care – PPO

## 2021-01-28 ENCOUNTER — Ambulatory Visit
Admission: RE | Admit: 2021-01-28 | Discharge: 2021-01-28 | Disposition: A | Discharge status: Home / Self Care | Payer: No Typology Code available for payment source | Source: Ambulatory Visit | Attending: Internal Medicine | Admitting: Internal Medicine

## 2021-01-28 DIAGNOSIS — Z136 Encounter for screening for cardiovascular disorders: Secondary | ICD-10-CM | POA: Diagnosis not present

## 2021-01-28 DIAGNOSIS — Z8249 Family history of ischemic heart disease and other diseases of the circulatory system: Secondary | ICD-10-CM | POA: Diagnosis not present

## 2021-01-28 DIAGNOSIS — E0590 Thyrotoxicosis, unspecified without thyrotoxic crisis or storm: Secondary | ICD-10-CM | POA: Diagnosis not present

## 2021-01-28 DIAGNOSIS — I10 Essential (primary) hypertension: Secondary | ICD-10-CM

## 2021-02-08 ENCOUNTER — Other Ambulatory Visit (HOSPITAL_COMMUNITY): Payer: Self-pay | Admitting: Endocrinology

## 2021-02-08 ENCOUNTER — Other Ambulatory Visit: Payer: Self-pay | Admitting: Endocrinology

## 2021-02-08 DIAGNOSIS — E0590 Thyrotoxicosis, unspecified without thyrotoxic crisis or storm: Secondary | ICD-10-CM

## 2021-03-18 DIAGNOSIS — E039 Hypothyroidism, unspecified: Secondary | ICD-10-CM | POA: Diagnosis not present

## 2021-03-28 DIAGNOSIS — E0590 Thyrotoxicosis, unspecified without thyrotoxic crisis or storm: Secondary | ICD-10-CM | POA: Diagnosis not present

## 2021-03-28 DIAGNOSIS — E039 Hypothyroidism, unspecified: Secondary | ICD-10-CM | POA: Diagnosis not present

## 2021-03-28 DIAGNOSIS — I10 Essential (primary) hypertension: Secondary | ICD-10-CM | POA: Diagnosis not present

## 2021-04-28 DIAGNOSIS — E039 Hypothyroidism, unspecified: Secondary | ICD-10-CM | POA: Diagnosis not present

## 2021-04-28 DIAGNOSIS — E0590 Thyrotoxicosis, unspecified without thyrotoxic crisis or storm: Secondary | ICD-10-CM | POA: Diagnosis not present

## 2021-05-17 DIAGNOSIS — E0590 Thyrotoxicosis, unspecified without thyrotoxic crisis or storm: Secondary | ICD-10-CM | POA: Diagnosis not present

## 2021-06-10 DIAGNOSIS — E0590 Thyrotoxicosis, unspecified without thyrotoxic crisis or storm: Secondary | ICD-10-CM | POA: Diagnosis not present

## 2021-09-05 DIAGNOSIS — E039 Hypothyroidism, unspecified: Secondary | ICD-10-CM | POA: Diagnosis not present

## 2021-11-08 DIAGNOSIS — J069 Acute upper respiratory infection, unspecified: Secondary | ICD-10-CM | POA: Diagnosis not present

## 2021-11-08 DIAGNOSIS — R051 Acute cough: Secondary | ICD-10-CM | POA: Diagnosis not present

## 2021-11-08 DIAGNOSIS — R519 Headache, unspecified: Secondary | ICD-10-CM | POA: Diagnosis not present

## 2021-11-08 DIAGNOSIS — J3489 Other specified disorders of nose and nasal sinuses: Secondary | ICD-10-CM | POA: Diagnosis not present

## 2021-11-25 DIAGNOSIS — Z125 Encounter for screening for malignant neoplasm of prostate: Secondary | ICD-10-CM | POA: Diagnosis not present

## 2021-11-25 DIAGNOSIS — Z0000 Encounter for general adult medical examination without abnormal findings: Secondary | ICD-10-CM | POA: Diagnosis not present

## 2021-12-02 DIAGNOSIS — Z833 Family history of diabetes mellitus: Secondary | ICD-10-CM | POA: Diagnosis not present

## 2021-12-02 DIAGNOSIS — R454 Irritability and anger: Secondary | ICD-10-CM | POA: Diagnosis not present

## 2021-12-02 DIAGNOSIS — I10 Essential (primary) hypertension: Secondary | ICD-10-CM | POA: Diagnosis not present

## 2021-12-02 DIAGNOSIS — Z0000 Encounter for general adult medical examination without abnormal findings: Secondary | ICD-10-CM | POA: Diagnosis not present

## 2022-04-19 DIAGNOSIS — R238 Other skin changes: Secondary | ICD-10-CM | POA: Diagnosis not present

## 2022-05-24 DIAGNOSIS — L821 Other seborrheic keratosis: Secondary | ICD-10-CM | POA: Diagnosis not present

## 2022-05-24 DIAGNOSIS — B9689 Other specified bacterial agents as the cause of diseases classified elsewhere: Secondary | ICD-10-CM | POA: Diagnosis not present

## 2022-05-24 DIAGNOSIS — L0202 Furuncle of face: Secondary | ICD-10-CM | POA: Diagnosis not present

## 2022-05-24 DIAGNOSIS — D225 Melanocytic nevi of trunk: Secondary | ICD-10-CM | POA: Diagnosis not present

## 2023-01-15 ENCOUNTER — Other Ambulatory Visit: Payer: Self-pay | Admitting: Otolaryngology

## 2023-01-15 DIAGNOSIS — R220 Localized swelling, mass and lump, head: Secondary | ICD-10-CM

## 2023-01-19 ENCOUNTER — Ambulatory Visit
Admission: RE | Admit: 2023-01-19 | Discharge: 2023-01-19 | Disposition: A | Discharge status: Home / Self Care | Payer: 59 | Source: Ambulatory Visit | Attending: Otolaryngology | Admitting: Otolaryngology

## 2023-01-19 DIAGNOSIS — R220 Localized swelling, mass and lump, head: Secondary | ICD-10-CM

## 2023-01-19 MED ORDER — IOPAMIDOL (ISOVUE-300) INJECTION 61%
100.0000 mL | Freq: Once | INTRAVENOUS | Status: AC | PRN
  Administered 2023-01-19: 100 mL via INTRAVENOUS

## 2023-04-30 ENCOUNTER — Telehealth: Payer: Self-pay | Admitting: Emergency Medicine

## 2023-04-30 NOTE — Telephone Encounter (Signed)
Patient's wife states that patient has jury duty on Monday, 12/9 and wanted to know if we can write a letter stating the importance of patient coming to his appointment on 12/10. Patient's wife wants to make sure jury duty does not run into Tuesday. Are we able to create a letter if we have never seen the patient? Patients wife is concerned the issue is getting worse. Please advise

## 2023-05-02 NOTE — Telephone Encounter (Signed)
Logan George wife checking on message for jury duty letter. Logan George phone number is [phone].

## 2023-05-03 NOTE — Telephone Encounter (Signed)
Okay to write a letter that indicates that the patient has an office visit with [place] on 12/10.  Typically we need the jury letter to obtain the juror number to include on our letter.

## 2023-05-03 NOTE — Telephone Encounter (Signed)
Called the pt's spouse and there was no answer- LMTCB.

## 2023-05-03 NOTE — Telephone Encounter (Signed)
Pt has not been seen here as of today. Has appt on 12/10, would we be able to write a letter ? Please advise

## 2023-05-08 ENCOUNTER — Encounter: Payer: Self-pay | Admitting: Emergency Medicine

## 2023-05-08 ENCOUNTER — Ambulatory Visit (INDEPENDENT_AMBULATORY_CARE_PROVIDER_SITE_OTHER): Payer: 59 | Admitting: Emergency Medicine

## 2023-05-08 VITALS — BP 130/90 | HR 97 | Temp 98.3°F | Ht 68.5 in | Wt 313.8 lb

## 2023-05-08 DIAGNOSIS — R911 Solitary pulmonary nodule: Secondary | ICD-10-CM

## 2023-05-08 NOTE — Patient Instructions (Signed)
VISIT SUMMARY:  During your visit, we discussed the findings from your recent CT scan of the neck, which incidentally revealed a small nodule in your lung. We also reviewed your concerns about submandibular swelling and your history of hypertension and kidney stones.  YOUR PLAN:  -PULMONARY NODULE: A pulmonary nodule is a small, round growth in the lung. Given your history of smoking, we will order a dedicated CT scan of your chest to get a clearer picture of the nodule and check for any additional ones. We will discuss the results once they are available, either in person or over the phone, depending on your work schedule.  -SUBMANDIBULAR SWELLING: Submandibular swelling refers to swelling under the jaw. Your recent CT scan did not show any significant issues in this area, so no further action is needed at this time.  -HYPERTENSION AND RENAL CALCULI: Hypertension is high blood pressure, and renal calculi are kidney stones. These chronic conditions were not the focus of today's visit, and no changes to your current management plan were discussed.  -GENERAL HEALTH MAINTENANCE: Continue to abstain from smoking to maintain your overall health.  INSTRUCTIONS:  Please schedule a dedicated CT scan of your chest at your earliest convenience. We will review the results together once they are available. If you have any questions or concerns in the meantime, feel free to reach out.

## 2023-05-08 NOTE — Addendum Note (Signed)
Addended by: Delrae Rend on: 05/08/2023 10:12 AM   Modules accepted: Orders

## 2023-05-08 NOTE — Assessment & Plan Note (Signed)
Pulmonary Nodule 3mm pleural base nodule in the right upper lobe identified on a CT scan of the neck. Patient has a significant smoking history (46 pack years) but quit 17 years ago. No current respiratory symptoms reported.  I reviewed the differential diagnosis with him today.  Also explained that if there are no other nodules larger than the existing 3 mm finding then he may not need serial imaging.  He does not qualify for lung cancer screening because he quit smoking greater than 15 years ago. -Order a dedicated CT scan of the chest to better characterize the nodule and identify any additional nodules. -Discuss results with patient in person or via phone, depending on patient's availability due to work schedule.

## 2023-05-08 NOTE — Progress Notes (Signed)
Subjective:    Patient ID: Logan George, male    DOB: 25-Aug-1970, 52 y.o.   MRN: [id number]  HPI The patient, a 52 year old with a significant history of tobacco use (46 pack years), hypertension, and renal calculi, presented for a consultation regarding an abnormal CT scan of the neck that incidentally revealed a 3mm pleural-based pulmonary nodule in the right upper lobe. The scan was initially performed due to the patient's complaint of submandibular swelling, which he described as a sensation of external swelling that would increase and decrease in size. The CT scan did not reveal any appreciable swelling or mass in the oral cavity, pharynx, or larynx, and no enlarged cervical chain lymph nodes or vascular abnormalities were noted.  The patient reported no respiratory symptoms or difficulties, and he did not report any coughing or mucus production. 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 The patient has a history of pulmonary embolism but has not undergone primary function testing. He quit smoking 17 years ago.  The patient expressed concern about the detected pulmonary nodule and was keen to undergo further evaluation to establish a baseline for future reference. He was particularly interested in having a full lung CT scan to check for additional nodules. 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   RADIOLOGY CT scan of neck soft tissues: No appreciable swelling or mass in the oral cavity, pharynx, or larynx. Punctate calcific focus in the superficial lobe of the right parotid gland, possibly a small salivary stone or postinflammatory calcification. No enlarged cervical chain lymph nodes or vascular abnormalities. Normal sinuses. 3 mm pleural base pulmonary nodule in the right upper lobe. (05/01/2023)   Review of  Systems As per HPI  Past Medical History:  Diagnosis Date   Hypertension    Kidney stones    Thyroid disease      No family history on file.   Social History   Socioeconomic History   Marital status: Married    Spouse name: Not on file   Number of children: Not on file   Years of education: Not on file   Highest education level: Not on file  Occupational History   Not on file  Tobacco Use   Smoking status: Former    Average packs/day: 2.0 packs/day for 23.0 years (46.0 ttl pk-yrs)    Types: Cigarettes    Start date: 05/29/1982   Smokeless tobacco: Former  Building services engineer status: Never Used  Substance and Sexual Activity   Alcohol use: No   Drug use: No   Sexual activity: Not on file  Other Topics Concern   Not on file  Social History Narrative   Not on file   Social Determinants of Health   Financial Resource Strain: Not on file  Food Insecurity: Not on file  Transportation Needs: Not on file  Physical Activity: Not on file  Stress: Not on file  Social Connections: Not on file  Intimate Partner Violence: Not on file     Allergies  Allergen Reactions   Sulfa Antibiotics Other (See Comments)    Nose bleeds, dizziness     Outpatient Medications Prior to Visit  Medication Sig Dispense Refill   ibuprofen (ADVIL,MOTRIN) 200 MG tablet Take 600 mg by mouth every 6 (six) hours as needed for fever, headache or mild pain.  olmesartan (BENICAR) 40 MG tablet Take 20 mg by mouth daily.      cyclobenzaprine (FLEXERIL) 10 MG tablet Take 1 tablet (10 mg total) by mouth 2 (two) times daily as needed for muscle spasms. (Patient not taking: Reported on 05/08/2023) 20 tablet 0   HYDROcodone-acetaminophen (NORCO/VICODIN) 5-325 MG tablet Take 1 tablet by mouth every 4 (four) hours as needed. (Patient not taking: Reported on 05/08/2023) 20 tablet 0   levothyroxine (SYNTHROID, LEVOTHROID) 50 MCG tablet Take 25 mcg by mouth daily before breakfast.  (Patient not taking:  Reported on 05/08/2023)     lidocaine (LIDODERM) 5 % Place 1 patch onto the skin daily. Remove & Discard patch within 12 hours or as directed by MD (Patient not taking: Reported on 05/08/2023) 30 patch 0   methocarbamol (ROBAXIN) 500 MG tablet methocarbamol 500 mg tablet  Take 1 tablet every day by oral route at bedtime for 7 days. (Patient not taking: Reported on 05/08/2023)     methylPREDNISolone (MEDROL DOSEPAK) 4 MG TBPK tablet TAKE 1 DOSE PK(S) EVERY DAY BY ORAL ROUTE AS DIRECTED FOR 6 DAYS. (Patient not taking: Reported on 05/08/2023)  0   ondansetron (ZOFRAN) 8 MG tablet ondansetron HCl 8 mg tablet  TAKE 1 TABLET BY MOUTH EVERY 8 HOURS FOR 2 DAYS (Patient not taking: Reported on 05/08/2023)     No facility-administered medications prior to visit.        Objective:   Physical Exam  Vitals:   05/08/23 0922  BP: (!) 132/98  Pulse: 97  Temp: 98.3 F (36.8 C)  TempSrc: Oral  SpO2: 97%  Weight: (!) 313 lb 12.8 oz (142.3 kg)  Height: 5' 8.5" (1.74 m)   Gen: Pleasant, obese gentleman, in no distress,  normal affect  ENT: No lesions,  mouth clear,  oropharynx clear, no postnasal drip  Neck: No JVD, no stridor  Lungs: No use of accessory muscles, no crackles or wheezing on normal respiration, no wheeze on forced expiration  Cardiovascular: RRR, heart sounds normal, no murmur or gallops, no peripheral edema  Musculoskeletal: No deformities, no cyanosis or clubbing  Neuro: alert, awake, non focal  Skin: Warm, no lesions or rashes      Assessment & Plan:  Pulmonary nodule Pulmonary Nodule 3mm pleural base nodule in the right upper lobe identified on a CT scan of the neck. Patient has a significant smoking history (46 pack years) but quit 17 years ago. No current respiratory symptoms reported.  I reviewed the differential diagnosis with him today.  Also explained that if there are no other nodules larger than the existing 3 mm finding then he may not need serial imaging.  He  does not qualify for lung cancer screening because he quit smoking greater than 15 years ago. -Order a dedicated CT scan of the chest to better characterize the nodule and identify any additional nodules. -Discuss results with patient in person or via phone, depending on patient's availability due to work schedule.    Levy Pupa, MD, PhD 05/08/2023, 9:58 AM [facility] [phone] or if no answer before 7:00PM call [phone] For any issues after 7:00PM please call eLink [phone]

## 2023-05-14 ENCOUNTER — Other Ambulatory Visit: Payer: 59

## 2023-05-25 ENCOUNTER — Ambulatory Visit
Admission: RE | Admit: 2023-05-25 | Discharge: 2023-05-25 | Disposition: A | Discharge status: Home / Self Care | Payer: 59 | Source: Ambulatory Visit | Attending: Emergency Medicine | Admitting: Emergency Medicine

## 2023-05-25 DIAGNOSIS — R911 Solitary pulmonary nodule: Secondary | ICD-10-CM

## 2023-05-25 MED ORDER — IOPAMIDOL (ISOVUE-300) INJECTION 61%
500.0000 mL | Freq: Once | INTRAVENOUS | Status: AC | PRN
  Administered 2023-05-25: 85 mL via INTRAVENOUS

## 2023-06-22 ENCOUNTER — Institutional Professional Consult (permissible substitution): Payer: 59 | Admitting: Emergency Medicine
